# Patient Record
Sex: Female | Born: 1965 | Race: White | Hispanic: No | Marital: Married | Smoking: Current every day smoker
Health system: Southern US, Community
[De-identification: ages and names within clinical notes are randomized; demographics above are authoritative.]

## PROBLEM LIST (undated history)

## (undated) DIAGNOSIS — K760 Fatty (change of) liver, not elsewhere classified: Secondary | ICD-10-CM

---

## 1999-08-23 ENCOUNTER — Inpatient Hospital Stay (HOSPITAL_COMMUNITY): Admission: AD | Admit: 1999-08-23 | Discharge: 1999-08-23 | Payer: Self-pay | Admitting: *Deleted

## 1999-09-01 ENCOUNTER — Inpatient Hospital Stay (HOSPITAL_COMMUNITY): Admission: AD | Admit: 1999-09-01 | Discharge: 1999-09-01 | Payer: Self-pay | Admitting: Obstetrics and Gynecology

## 1999-09-06 ENCOUNTER — Encounter: Admission: RE | Admit: 1999-09-06 | Discharge: 1999-12-05 | Payer: Self-pay | Admitting: *Deleted

## 1999-09-21 ENCOUNTER — Inpatient Hospital Stay (HOSPITAL_COMMUNITY): Admission: AD | Admit: 1999-09-21 | Discharge: 1999-09-21 | Payer: Self-pay | Admitting: Obstetrics and Gynecology

## 1999-09-23 ENCOUNTER — Inpatient Hospital Stay (HOSPITAL_COMMUNITY): Admission: AD | Admit: 1999-09-23 | Discharge: 1999-09-23 | Payer: Self-pay | Admitting: Obstetrics and Gynecology

## 1999-10-10 ENCOUNTER — Inpatient Hospital Stay (HOSPITAL_COMMUNITY): Admission: AD | Admit: 1999-10-10 | Discharge: 1999-10-10 | Payer: Self-pay | Admitting: Obstetrics and Gynecology

## 1999-10-29 ENCOUNTER — Inpatient Hospital Stay (HOSPITAL_COMMUNITY): Admission: AD | Admit: 1999-10-29 | Discharge: 1999-10-31 | Payer: Self-pay | Admitting: Obstetrics and Gynecology

## 1999-10-29 ENCOUNTER — Encounter (INDEPENDENT_AMBULATORY_CARE_PROVIDER_SITE_OTHER): Payer: Self-pay

## 2000-12-28 ENCOUNTER — Other Ambulatory Visit: Admission: RE | Admit: 2000-12-28 | Discharge: 2000-12-28 | Payer: Self-pay | Admitting: *Deleted

## 2002-01-08 ENCOUNTER — Other Ambulatory Visit: Admission: RE | Admit: 2002-01-08 | Discharge: 2002-01-08 | Payer: Self-pay | Admitting: *Deleted

## 2002-10-02 ENCOUNTER — Other Ambulatory Visit: Admission: RE | Admit: 2002-10-02 | Discharge: 2002-10-02 | Payer: Self-pay | Admitting: Obstetrics and Gynecology

## 2002-10-03 ENCOUNTER — Other Ambulatory Visit: Admission: RE | Admit: 2002-10-03 | Discharge: 2002-10-03 | Payer: Self-pay | Admitting: Obstetrics and Gynecology

## 2002-12-05 ENCOUNTER — Encounter: Admission: RE | Admit: 2002-12-05 | Discharge: 2003-03-05 | Payer: Self-pay | Admitting: Obstetrics and Gynecology

## 2003-03-10 ENCOUNTER — Inpatient Hospital Stay (HOSPITAL_COMMUNITY): Admission: AD | Admit: 2003-03-10 | Discharge: 2003-03-10 | Payer: Self-pay | Admitting: Obstetrics and Gynecology

## 2003-04-02 ENCOUNTER — Inpatient Hospital Stay (HOSPITAL_COMMUNITY): Admission: AD | Admit: 2003-04-02 | Discharge: 2003-04-02 | Payer: Self-pay | Admitting: Obstetrics and Gynecology

## 2003-04-03 ENCOUNTER — Inpatient Hospital Stay (HOSPITAL_COMMUNITY): Admission: AD | Admit: 2003-04-03 | Discharge: 2003-04-03 | Payer: Self-pay

## 2003-04-10 ENCOUNTER — Emergency Department (HOSPITAL_COMMUNITY): Admission: EM | Admit: 2003-04-10 | Discharge: 2003-04-10 | Payer: Self-pay | Admitting: Emergency Medicine

## 2003-04-14 ENCOUNTER — Inpatient Hospital Stay (HOSPITAL_COMMUNITY): Admission: AD | Admit: 2003-04-14 | Discharge: 2003-04-14 | Payer: Self-pay | Admitting: Obstetrics and Gynecology

## 2003-04-30 ENCOUNTER — Inpatient Hospital Stay (HOSPITAL_COMMUNITY): Admission: AD | Admit: 2003-04-30 | Discharge: 2003-05-02 | Payer: Self-pay | Admitting: Obstetrics and Gynecology

## 2003-06-03 ENCOUNTER — Other Ambulatory Visit: Admission: RE | Admit: 2003-06-03 | Discharge: 2003-06-03 | Payer: Self-pay | Admitting: Obstetrics and Gynecology

## 2006-10-22 ENCOUNTER — Encounter: Admission: RE | Admit: 2006-10-22 | Discharge: 2006-10-22 | Payer: Self-pay | Admitting: Obstetrics and Gynecology

## 2007-10-31 ENCOUNTER — Encounter: Admission: RE | Admit: 2007-10-31 | Discharge: 2007-10-31 | Payer: Self-pay | Admitting: Obstetrics and Gynecology

## 2008-12-15 ENCOUNTER — Encounter: Admission: RE | Admit: 2008-12-15 | Discharge: 2008-12-15 | Payer: Self-pay | Admitting: Obstetrics and Gynecology

## 2010-05-17 ENCOUNTER — Emergency Department (HOSPITAL_COMMUNITY): Admission: EM | Admit: 2010-05-17 | Discharge: 2010-05-17 | Payer: Self-pay | Admitting: Emergency Medicine

## 2010-12-18 ENCOUNTER — Encounter: Payer: Self-pay | Admitting: Obstetrics and Gynecology

## 2011-04-14 NOTE — Consult Note (Signed)
   NAMEJOVONNA, [name]                           ACCOUNT NO.:  [id number]   MEDICAL RECORD NO.:  [id number]                   PATIENT TYPE:  MAT   LOCATION:  [facility]                                 FACILITY:  [facility]   PHYSICIAN:  Lenoard Aden, M.D.             DATE OF BIRTH:  07-29-1966   DATE OF CONSULTATION:  DATE OF DISCHARGE:                                   CONSULTATION   CHIEF COMPLAINT:  Rule out labor.   HISTORY OF PRESENT ILLNESS:  The patient is a 45 year old white female, G2,  P63, [facility] May 15, 2003 at 35-4/7 weeks who presents to rule out preterm labor  at EDD of May 15, 2003.   ALLERGIES:  No known drug allergies.   MEDICATIONS:  Prenatal vitamins.   PAST MEDICAL HISTORY:  Spontaneous female born in December 2000.  History of  LEEP.  History of preterm labor.  History of insulin-dependent gestational  diabetes with first pregnancy.   PRENATAL LAB DATA:  Blood type O positive.  Rh-negative.  Rubella immune.  Hepatitis B surface antigen negative.  HIV nonreactive.   The patient's pregnancy is complicated by preterm cervical change and  gestational diabetes which is diet controlled.   PHYSICAL EXAMINATION:  GENERAL:  She is a well-developed, well-nourished,  white female in no apparent distress.  HEENT:  Normal.  LUNGS:  Clear.  HEART:  Regular rhythm.  ABDOMEN:  Soft, gravid, and nontender.  CERVIX:  2 cm, long, vertex, -1.  EXTREMITIES:  No cords.  NEUROLOGIC:  Nonfocal.   NST is reactive.  Contractions are irregular.   IMPRESSION:  1. Prodrome of labor at 35+ weeks.  2. Gestational diabetes, stable.   PLAN:  Ambulate and recheck cervix.  Will not tocolyse at this time.  If  active labor, will admit.  If no evidence of active labor, will discharge  home.                                               Lenoard Aden, M.D.   RJT/MEDQ  D:  04/14/2003  T:  04/14/2003  Job:  [id number]

## 2011-04-14 NOTE — Op Note (Signed)
[facility]  Patient:    [name]                         MRN: [id number] Proc. Date: 10/30/99 Adm. Date:  [id number] Attending:  Ardeen Fillers                           Operative Report  PREOPERATIVE DIAGNOSIS:       Status post vacuum extraction, right vaginal laceration, third degree perineal laceration.  POSTOPERATIVE DIAGNOSIS:      Status post vacuum extraction, right vaginal laceration, third degree perineal laceration, perianal condylomas.  OPERATION:                    Examination under anesthesia, repair of right vaginal laceration, repair of third degree perineal laceration, excision of perianal condylomas.  SURGEON:                      Sheronette A. Cherly Hensen, M.D.  ASSISTANT:                    Pershing Cox, M.D.  ANESTHESIA:                   Spinal.  ESTIMATED BLOOD LOSS:  INDICATIONS:                  This is a 45 year old, gravida 1, para 1, female,  status post vacuum extraction on October 29, 1999, secondary to persistent deep  variable decellerations and who on inspection of her perineum and vagina postprocedure was found to have right vaginal sulcus laceration as well as extension of her midline episiotomy to the third degree.  The patient had had local anesthesia for the midline episiotomy, however, she could not remain relaxed enough to secure the repair of the lacerations that had been noted.  The decision was therefore made to transfer the patient to the operating room in order to complete the repair of her lacerations.  The patient and husband was informed of this and consent encompassed the need for repair with such findings.  DESCRIPTION OF PROCEDURE:     Under adequate spinal anesthesia, the patient was  placed in the dorsal lithotomy position.  She was sterilely prepped and draped n the usual fashion.  The bladder had been catheterized for a small amount of urine. In this relaxed state, the  patient was fully examined.  The vagina was examined  circumferentially.  There was a right vaginal laceration.  The cervix was inspected with no cervical laceration noted.  The midline episiotomy was inspected and was found to have extended to not fully to the sphincter area.  In addition there were three perianal warts at 6 oclock in that perianal area.  Using vaginal retractors, the vaginal laceration was exposed.  The upper apex of its extent was noted. The vaginal mucosa was approximated with 3-0 chromic sutures in a running locked stitch to the level of the hymenal ring.  The midline episiotomy was started enclosure  with the vaginal apex to that episiotomy being closed with a 3-0 chromic running locked stitch to the level of the hymenal ring and taken to the under submucosal area.  The sphincter edges were identified, clamped with Allis clamps, and approximated using 0 Vicryl figure-of-eight sutures.  The remainder of the layers were then closed with 3-0 chromic suture.  The skin was approximated with 3-0 chromic GI suture.  Attention was then turned to the perianal area.  Using a #15 blade, the perianal warts were removed superficially and the skin approximated ith 3-0 chromic suture.  Specimen was the perianal warts.  The placenta which had been part of her delivery was not sent, although, it was noted to have a short cord.  Estimated blood loss was minimal.  Complications were none.  The patient tolerated the procedure well and was transferred to the recovery room in stable condition. DD:  10/30/99 TD:  10/31/99 Job: [id number]
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
## 2018-02-14 ENCOUNTER — Encounter (HOSPITAL_BASED_OUTPATIENT_CLINIC_OR_DEPARTMENT_OTHER): Payer: Self-pay | Admitting: *Deleted

## 2018-02-14 ENCOUNTER — Other Ambulatory Visit: Payer: Self-pay

## 2018-02-14 ENCOUNTER — Emergency Department (HOSPITAL_BASED_OUTPATIENT_CLINIC_OR_DEPARTMENT_OTHER)
Admission: EM | Admit: 2018-02-14 | Discharge: 2018-02-15 | Disposition: A | Discharge status: Home / Self Care | Payer: BLUE CROSS/BLUE SHIELD | Attending: Emergency Medicine | Admitting: Emergency Medicine

## 2018-02-14 DIAGNOSIS — R0981 Nasal congestion: Secondary | ICD-10-CM | POA: Diagnosis not present

## 2018-02-14 DIAGNOSIS — M791 Myalgia, unspecified site: Secondary | ICD-10-CM | POA: Insufficient documentation

## 2018-02-14 DIAGNOSIS — M2550 Pain in unspecified joint: Secondary | ICD-10-CM | POA: Diagnosis not present

## 2018-02-14 DIAGNOSIS — R112 Nausea with vomiting, unspecified: Secondary | ICD-10-CM | POA: Diagnosis not present

## 2018-02-14 DIAGNOSIS — F17200 Nicotine dependence, unspecified, uncomplicated: Secondary | ICD-10-CM | POA: Diagnosis not present

## 2018-02-14 DIAGNOSIS — R1110 Vomiting, unspecified: Secondary | ICD-10-CM | POA: Diagnosis present

## 2018-02-14 DIAGNOSIS — B349 Viral infection, unspecified: Secondary | ICD-10-CM | POA: Insufficient documentation

## 2018-02-14 DIAGNOSIS — R509 Fever, unspecified: Secondary | ICD-10-CM | POA: Insufficient documentation

## 2018-02-14 DIAGNOSIS — J111 Influenza due to unidentified influenza virus with other respiratory manifestations: Secondary | ICD-10-CM | POA: Diagnosis not present

## 2018-02-14 MED ORDER — ONDANSETRON HCL 4 MG/2ML IJ SOLN
4.0000 mg | Freq: Once | INTRAMUSCULAR | Status: AC
  Administered 2018-02-15: 4 mg via INTRAVENOUS
  Filled 2018-02-14: qty 2

## 2018-02-14 MED ORDER — SODIUM CHLORIDE 0.9 % IV BOLUS (SEPSIS)
1000.0000 mL | Freq: Once | INTRAVENOUS | Status: AC
  Administered 2018-02-14: 1000 mL via INTRAVENOUS

## 2018-02-14 NOTE — ED Triage Notes (Signed)
She had a positive flu test at [facility] today. Vomiting all day today. She feels dehydrated. She was given Tamiflu Rx. She took Tylenol 4 hours ago.

## 2018-02-15 ENCOUNTER — Encounter (HOSPITAL_BASED_OUTPATIENT_CLINIC_OR_DEPARTMENT_OTHER): Payer: Self-pay | Admitting: Emergency Medicine

## 2018-02-15 LAB — BASIC METABOLIC PANEL
Anion gap: 10 (ref 5–15)
BUN: 14 mg/dL (ref 6–20)
CALCIUM: 9.2 mg/dL (ref 8.9–10.3)
CHLORIDE: 102 mmol/L (ref 101–111)
CO2: 23 mmol/L (ref 22–32)
Creatinine, Ser: 0.81 mg/dL (ref 0.44–1.00)
GFR calc Af Amer: >60 mL/min (ref >60)
GFR calc non Af Amer: >60 mL/min (ref >60)
GLUCOSE: 141 mg/dL — AB (ref 65–99)
Potassium: 4 mmol/L (ref 3.5–5.1)
Sodium: 135 mmol/L (ref 135–145)

## 2018-02-15 MED ORDER — IBUPROFEN 400 MG PO TABS
400.0000 mg | ORAL_TABLET | Freq: Once | ORAL | Status: AC | PRN
  Administered 2018-02-15: 400 mg via ORAL
  Filled 2018-02-15: qty 1

## 2018-02-15 MED ORDER — ONDANSETRON 8 MG PO TBDP: ORAL_TABLET | ORAL | 0 refills | Status: DC

## 2018-02-15 MED ORDER — BENZONATATE 100 MG PO CAPS
200.0000 mg | ORAL_CAPSULE | Freq: Once | ORAL | Status: DC
  Filled 2018-02-15: qty 2

## 2018-02-15 MED ORDER — ACETAMINOPHEN 500 MG PO TABS
1000.0000 mg | ORAL_TABLET | Freq: Once | ORAL | Status: AC
  Administered 2018-02-15: 1000 mg via ORAL
  Filled 2018-02-15: qty 2

## 2018-02-15 NOTE — ED Provider Notes (Signed)
[facility] EMERGENCY DEPARTMENT Provider Note   CSN: [id number] Arrival date & time: 02/14/18  2227     History   Chief Complaint Chief Complaint  Patient presents with  . Influenza    HPI [name] is a 52 y.o. female.  The history is provided by the patient.  Emesis   This is a new problem. The current episode started 6 to 12 hours ago. The problem occurs 2 to 4 times per day. The problem has not changed since onset.The emesis has an appearance of stomach contents. The maximum temperature recorded prior to her arrival was 101 to 101.9 F. The fever has been present for 3 to 4 days. Associated symptoms include arthralgias, diarrhea, a fever and myalgias. Pertinent negatives include no abdominal pain. Risk factors include ill contacts.  Influenza  Presenting symptoms: diarrhea, fever, myalgias, nausea and vomiting   Severity:  Moderate Onset quality:  Gradual Progression:  Unchanged Chronicity:  New Relieved by:  Nothing Worsened by:  Nothing Associated symptoms: nasal congestion   Associated symptoms: no neck stiffness   Risk factors: not elderly   Diagnosed with influenza earlier in the week now on tamiflu with HA n/v/d.    History reviewed. No pertinent past medical history.  There are no active problems to display for this patient.   History reviewed. No pertinent surgical history.  OB History   None      Home Medications    Prior to Admission medications   Medication Sig Start Date End Date Taking? Authorizing Provider  ondansetron (ZOFRAN ODT) 8 MG disintegrating tablet 8mg  ODT q12 hours prn nausea 02/15/18   Addeline Calarco, MD    Family History No family history on file.  Social History Social History   Tobacco Use  . Smoking status: Current Every Day Smoker  . Smokeless tobacco: Never Used  Substance Use Topics  . Alcohol use: Yes  . Drug use: Never     Allergies   Patient has no known allergies.   Review of  Systems Review of Systems  Constitutional: Positive for fever.  HENT: Positive for congestion.   Gastrointestinal: Positive for diarrhea, nausea and vomiting. Negative for abdominal pain.  Musculoskeletal: Positive for arthralgias and myalgias. Negative for neck pain and neck stiffness.  All other systems reviewed and are negative.    Physical Exam Updated Vital Signs BP 108/75 (BP Location: Right Arm)   Pulse 87   Temp 99.8 F (37.7 C) (Oral)   Resp 16   Ht 5\' 3"  (1.6 m)   Wt 73.5 kg (162 lb)   SpO2 95%   BMI 28.70 kg/m   Physical Exam  Constitutional: She is oriented to person, place, and time. She appears well-developed and well-nourished. No distress.  HENT:  Head: Normocephalic and atraumatic.  Mouth/Throat: No oropharyngeal exudate.  Eyes: Pupils are equal, round, and reactive to light. Conjunctivae are normal.  Neck: Normal range of motion. Neck supple.  Cardiovascular: Normal rate, regular rhythm, normal heart sounds and intact distal pulses.  Pulmonary/Chest: Effort normal and breath sounds normal. No stridor. She has no wheezes. She has no rales.  Abdominal: Soft. Bowel sounds are normal. She exhibits no mass. There is no tenderness. There is no rebound and no guarding.  Musculoskeletal: Normal range of motion.  Neurological: She is alert and oriented to person, place, and time. She displays normal reflexes.  Skin: Skin is warm and dry. Capillary refill takes less than 2 seconds.  Psychiatric: She has a  normal mood and affect.     ED Treatments / Results  Labs (all labs ordered are listed, but only abnormal results are displayed) Results for orders placed or performed during the hospital encounter of 02/14/18  Basic metabolic panel  Result Value Ref Range   Sodium 135 135 - 145 mmol/L   Potassium 4.0 3.5 - 5.1 mmol/L   Chloride 102 101 - 111 mmol/L   CO2 23 22 - 32 mmol/L   Glucose, Bld 141 (H) 65 - 99 mg/dL   BUN 14 6 - 20 mg/dL   Creatinine, Ser 1.610.81  0.44 - 1.00 mg/dL   Calcium 9.2 8.9 - 09.610.3 mg/dL   GFR calc non Af Amer >60 >60 mL/min   GFR calc Af Amer >60 >60 mL/min   Anion gap 10 5 - 15   No results found.  Procedures Procedures (including critical care time)  Medications Ordered in ED Medications  benzonatate (TESSALON) capsule 200 mg (200 mg Oral Refused 02/15/18 0149)  sodium chloride 0.9 % bolus 1,000 mL (0 mLs Intravenous Stopped 02/15/18 0149)  ondansetron (ZOFRAN) injection 4 mg (4 mg Intravenous Given 02/15/18 0005)  ibuprofen (ADVIL,MOTRIN) tablet 400 mg (400 mg Oral Given 02/15/18 0005)  acetaminophen (TYLENOL) tablet 1,000 mg (1,000 mg Oral Given 02/15/18 0149)      Final Clinical Impressions(s) / ED Diagnoses   Final diagnoses:  Influenza  Viral illness  Non-intractable vomiting with nausea, unspecified vomiting type  N/v/d could be side effects of tamiflu or noro virus.  PO challenged successfully   Return for weakness, numbness, changes in vision or speech, fevers >100.4 unrelieved by medication, shortness of breath, intractable vomiting, or diarrhea, abdominal pain, Inability to tolerate liquids or food, cough, altered mental status or any concerns. No signs of systemic illness or infection. The patient is nontoxic-appearing on exam and vital signs are within normal limits.   I have reviewed the triage vital signs and the nursing notes. Pertinent labs &imaging results that were available during my care of the patient were reviewed by me and considered in my medical decision making (see chart for details).  After history, exam, and medical workup I feel the patient has been appropriately medically screened and is safe for discharge home. Pertinent diagnoses were discussed with the patient. Patient was given return precautions. ED Discharge Orders        Ordered    ondansetron (ZOFRAN ODT) 8 MG disintegrating tablet     02/15/18 0141       Karalee Hauter, MD 02/15/18 0410

## 2018-02-15 NOTE — ED Notes (Signed)
Pt. Was diagnosed with Flu on Thursday at [facility].

## 2021-12-31 ENCOUNTER — Other Ambulatory Visit: Payer: Self-pay

## 2021-12-31 ENCOUNTER — Encounter (HOSPITAL_BASED_OUTPATIENT_CLINIC_OR_DEPARTMENT_OTHER): Payer: Self-pay

## 2021-12-31 ENCOUNTER — Emergency Department (HOSPITAL_BASED_OUTPATIENT_CLINIC_OR_DEPARTMENT_OTHER)
Admission: EM | Admit: 2021-12-31 | Discharge: 2022-01-01 | Disposition: A | Discharge status: Home / Self Care | Payer: BC Managed Care – PPO | Attending: Emergency Medicine | Admitting: Emergency Medicine

## 2021-12-31 DIAGNOSIS — R030 Elevated blood-pressure reading, without diagnosis of hypertension: Secondary | ICD-10-CM | POA: Insufficient documentation

## 2021-12-31 DIAGNOSIS — R519 Headache, unspecified: Secondary | ICD-10-CM | POA: Diagnosis not present

## 2021-12-31 DIAGNOSIS — R0789 Other chest pain: Secondary | ICD-10-CM | POA: Diagnosis not present

## 2021-12-31 DIAGNOSIS — F17200 Nicotine dependence, unspecified, uncomplicated: Secondary | ICD-10-CM | POA: Diagnosis not present

## 2021-12-31 HISTORY — DX: Fatty (change of) liver, not elsewhere classified: K76.0

## 2021-12-31 HISTORY — DX: Hemochromatosis, unspecified: E83.119

## 2021-12-31 LAB — BASIC METABOLIC PANEL
Anion gap: 9 (ref 5–15)
BUN: 13 mg/dL (ref 6–20)
CO2: 25 mmol/L (ref 22–32)
Calcium: 9.6 mg/dL (ref 8.9–10.3)
Chloride: 104 mmol/L (ref 98–111)
Creatinine, Ser: 0.84 mg/dL (ref 0.44–1.00)
GFR, Estimated: >60 mL/min (ref >60)
Glucose, Bld: 115 mg/dL — ABNORMAL HIGH (ref 70–99)
Potassium: 3.8 mmol/L (ref 3.5–5.1)
Sodium: 138 mmol/L (ref 135–145)

## 2021-12-31 LAB — CBC WITH DIFFERENTIAL/PLATELET
Abs Immature Granulocytes: 0.02 10*3/uL (ref 0.00–0.07)
Basophils Absolute: 0 10*3/uL (ref 0.0–0.1)
Basophils Relative: 1 %
Eosinophils Absolute: 0.1 10*3/uL (ref 0.0–0.5)
Eosinophils Relative: 1 %
HCT: 42.1 % (ref 36.0–46.0)
Hemoglobin: 14.5 g/dL (ref 12.0–15.0)
Immature Granulocytes: 0 %
Lymphocytes Relative: 39 %
Lymphs Abs: 3.2 10*3/uL (ref 0.7–4.0)
MCH: 33.3 pg (ref 26.0–34.0)
MCHC: 34.4 g/dL (ref 30.0–36.0)
MCV: 96.8 fL (ref 80.0–100.0)
Monocytes Absolute: 0.7 10*3/uL (ref 0.1–1.0)
Monocytes Relative: 9 %
Neutro Abs: 4 10*3/uL (ref 1.7–7.7)
Neutrophils Relative %: 50 %
Platelets: 274 10*3/uL (ref 150–400)
RBC: 4.35 MIL/uL (ref 3.87–5.11)
RDW: 11.8 % (ref 11.5–15.5)
WBC: 8.1 10*3/uL (ref 4.0–10.5)
nRBC: 0 % (ref 0.0–0.2)

## 2021-12-31 LAB — TROPONIN I (HIGH SENSITIVITY): Troponin I (High Sensitivity): 4 ng/L (ref <18)

## 2021-12-31 MED ORDER — KETOROLAC TROMETHAMINE 15 MG/ML IJ SOLN
15.0000 mg | Freq: Once | INTRAMUSCULAR | Status: AC
  Administered 2022-01-01: 15 mg via INTRAVENOUS
  Filled 2021-12-31: qty 1

## 2021-12-31 NOTE — ED Triage Notes (Signed)
Pt noted to be hypertensive since giving blood yesterday (SBP ~200/ DBP~108). Pt reports HA that started ~1930 tonight and chest tightness since 2130-2200.

## 2021-12-31 NOTE — ED Provider Notes (Signed)
[facility] Provider Note: Lowella Dell, MD, FACEP  CSN: [id number] MRN: [id number] ARRIVAL: 12/31/21 at 2246 ROOM: MH11/MH11   CHIEF COMPLAINT  Hypertension   HISTORY OF PRESENT ILLNESS  12/31/21 11:45 PM [name] is a 56 y.o. 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  She has had 2 such phlebotomies in the past.  She was seen in the clinic yesterday and today but no phlebotomy was done due to elevated blood pressure (as high as 200/108).  She is here with a headache that began about 7:30 PM.  She describes the headache as being in the occipital region and posterior neck and she rates it as a 4 out of 10.  It is dull in nature.  She has also had some chest tightness in her lower chest since about 9:30 PM which has now moved into her epigastrium and feels like she needs to belch.  She denies nausea.  Current blood pressure is 166/91.   Past Medical History:  Diagnosis Date   Fatty liver    Hemochromatosis     History reviewed. No pertinent surgical history.  History reviewed. No pertinent family history.  Social History   Tobacco Use   Smoking status: Every Day   Smokeless tobacco: Never  Vaping Use   Vaping Use: Never used  Substance Use Topics   Alcohol use: Yes    Comment: casually   Drug use: Never    Prior to Admission medications   Not on File    Allergies Patient has no known allergies.   REVIEW OF SYSTEMS  Negative except as noted here or in the History of Present Illness.   PHYSICAL EXAMINATION  Initial Vital Signs Blood pressure (!) 205/100, pulse 71, temperature 98.9 F (37.2 C), temperature source Oral, resp. rate 16, height 5\' 3"  (1.6 m), weight 70.8 kg, last menstrual period 11/28/2019, SpO2 100 %.  Examination General: Well-developed, well-nourished female in no acute distress; appearance consistent with age of record HENT: normocephalic; atraumatic Eyes: Normal  appearance Neck: supple Heart: regular rate and rhythm Lungs: clear to auscultation bilaterally Chest: Nontender Abdomen: soft; nondistended; nontender; bowel sounds present Extremities: No deformity; full range of motion; pulses normal Neurologic: Awake, alert and oriented; motor function intact in all extremities and symmetric; no facial droop Skin: Warm and dry Psychiatric: Normal mood and affect   RESULTS  Summary of this visit's results, reviewed and interpreted by myself:   EKG Interpretation  Date/Time:  Saturday December 31 2021 23:02:03 EST Ventricular Rate:  75 PR Interval:  162 QRS Duration: 102 QT Interval:  413 QTC Calculation: 462 R Axis:   10 Text Interpretation: Sinus rhythm Normal ECG No previous ECGs available Confirmed by Sebastion Jun, 05-10-1996 (Jonny Ruiz) on 12/31/2021 11:07:44 PM       Laboratory Studies: Results for orders placed or performed during the hospital encounter of 12/31/21 (from the past 24 hour(s))  CBC with Differential/Platelet     Status: None   Collection Time: 12/31/21 11:11 PM  Result Value Ref Range   WBC 8.1 4.0 - 10.5 K/uL   RBC 4.35 3.87 - 5.11 MIL/uL   Hemoglobin 14.5 12.0 - 15.0 g/dL   HCT 02/28/22 71.2 - 45.8 %   MCV 96.8 80.0 - 100.0 fL   MCH 33.3 26.0 - 34.0 pg   MCHC 34.4 30.0 - 36.0 g/dL   RDW 09.9 83.3 - 82.5 %   Platelets 274 150 - 400 K/uL  nRBC 0.0 0.0 - 0.2 %   Neutrophils Relative % 50 %   Neutro Abs 4.0 1.7 - 7.7 K/uL   Lymphocytes Relative 39 %   Lymphs Abs 3.2 0.7 - 4.0 K/uL   Monocytes Relative 9 %   Monocytes Absolute 0.7 0.1 - 1.0 K/uL   Eosinophils Relative 1 %   Eosinophils Absolute 0.1 0.0 - 0.5 K/uL   Basophils Relative 1 %   Basophils Absolute 0.0 0.0 - 0.1 K/uL   Immature Granulocytes 0 %   Abs Immature Granulocytes 0.02 0.00 - 0.07 K/uL  Basic metabolic panel     Status: Abnormal   Collection Time: 12/31/21 11:11 PM  Result Value Ref Range   Sodium 138 135 - 145 mmol/L   Potassium 3.8 3.5 - 5.1 mmol/L    Chloride 104 98 - 111 mmol/L   CO2 25 22 - 32 mmol/L   Glucose, Bld 115 (H) 70 - 99 mg/dL   BUN 13 6 - 20 mg/dL   Creatinine, Ser 1.49 0.44 - 1.00 mg/dL   Calcium 9.6 8.9 - 70.2 mg/dL   GFR, Estimated >63 >78 mL/min   Anion gap 9 5 - 15  Troponin I (High Sensitivity)     Status: None   Collection Time: 12/31/21 11:11 PM  Result Value Ref Range   Troponin I (High Sensitivity) 4 <18 ng/L  Troponin I (High Sensitivity)     Status: None   Collection Time: 01/01/22  1:55 AM  Result Value Ref Range   Troponin I (High Sensitivity) 3 <18 ng/L   Imaging Studies: No results found.  ED COURSE and MDM  Nursing notes, initial and subsequent vitals signs, including pulse oximetry, reviewed and interpreted by myself.  Vitals:   01/01/22 0130 01/01/22 0145 01/01/22 0200 01/01/22 0215  BP: (!) 141/77 (!) 134/91 (!) 129/115 121/66  Pulse: (!) 56 (!) 56 66 (!) 59  Resp: 10 13 14 14   Temp:      TempSrc:      SpO2: 98% 98% 100% 96%  Weight:      Height:       Medications  ketorolac (TORADOL) 15 MG/ML injection 15 mg (15 mg Intravenous Given 01/01/22 0003)   2:42 AM Blood pressure is normal at 121/66.  It is unclear why it went so high earlier.  I do not see a need to introduce antihypertensives at this point as there is no evidence of endorgan damage.  She will need to follow-up with her PCP and/or hematologist.  Her headache has improved after IV Toradol.  EKG and troponins are normal.   PROCEDURES  Procedures   ED DIAGNOSES     ICD-10-CM   1. Elevated blood pressure reading without diagnosis of hypertension  R03.0     2. Occipital headache  R51.9          Quantay Zaremba, 03/01/22, MD 01/01/22 [phone]

## 2022-01-01 LAB — TROPONIN I (HIGH SENSITIVITY): Troponin I (High Sensitivity): 3 ng/L (ref <18)
